# Patient Record
Sex: Female | Born: 1945 | Hispanic: No
Health system: Southern US, Community
[De-identification: ages and names within clinical notes are randomized; demographics above are authoritative.]

## PROBLEM LIST (undated history)

## (undated) DIAGNOSIS — Z930 Tracheostomy status: Secondary | ICD-10-CM

## (undated) DIAGNOSIS — Z862 Personal history of diseases of the blood and blood-forming organs and certain disorders involving the immune mechanism: Secondary | ICD-10-CM

## (undated) DIAGNOSIS — Z951 Presence of aortocoronary bypass graft: Secondary | ICD-10-CM

## (undated) DIAGNOSIS — I10 Essential (primary) hypertension: Secondary | ICD-10-CM

## (undated) DIAGNOSIS — I2510 Atherosclerotic heart disease of native coronary artery without angina pectoris: Secondary | ICD-10-CM

## (undated) DIAGNOSIS — E119 Type 2 diabetes mellitus without complications: Secondary | ICD-10-CM

## (undated) DIAGNOSIS — G4733 Obstructive sleep apnea (adult) (pediatric): Secondary | ICD-10-CM

---

## 2014-08-27 ENCOUNTER — Inpatient Hospital Stay
Admission: AD | Admit: 2014-08-27 | Discharge: 2014-09-24 | Disposition: A | Discharge status: Skilled Nursing Facility | Payer: Medicare PPO | Source: Ambulatory Visit | Attending: Internal Medicine | Admitting: Internal Medicine

## 2014-08-27 ENCOUNTER — Other Ambulatory Visit (HOSPITAL_COMMUNITY): Payer: Self-pay

## 2014-08-27 DIAGNOSIS — J9690 Respiratory failure, unspecified, unspecified whether with hypoxia or hypercapnia: Secondary | ICD-10-CM

## 2014-08-27 DIAGNOSIS — Z09 Encounter for follow-up examination after completed treatment for conditions other than malignant neoplasm: Secondary | ICD-10-CM | POA: Insufficient documentation

## 2014-08-27 DIAGNOSIS — Z931 Gastrostomy status: Secondary | ICD-10-CM

## 2014-08-27 HISTORY — DX: Atherosclerotic heart disease of native coronary artery without angina pectoris: I25.10

## 2014-08-27 HISTORY — DX: Obstructive sleep apnea (adult) (pediatric): G47.33

## 2014-08-27 HISTORY — DX: Type 2 diabetes mellitus without complications: E11.9

## 2014-08-27 HISTORY — DX: Essential (primary) hypertension: I10

## 2014-08-27 HISTORY — DX: Tracheostomy status: Z93.0

## 2014-08-27 HISTORY — DX: Personal history of diseases of the blood and blood-forming organs and certain disorders involving the immune mechanism: Z86.2

## 2014-08-27 HISTORY — DX: Presence of aortocoronary bypass graft: Z95.1

## 2014-08-31 ENCOUNTER — Other Ambulatory Visit (HOSPITAL_COMMUNITY): Payer: Self-pay

## 2014-08-31 LAB — COMPREHENSIVE METABOLIC PANEL
ALBUMIN: 2.6 g/dL — AB (ref 3.5–5.2)
ALK PHOS: 114 U/L (ref 39–117)
ALT: 21 U/L (ref 0–35)
ANION GAP: 3 — AB (ref 5–15)
AST: 24 U/L (ref 0–37)
BUN: 22 mg/dL (ref 6–23)
CALCIUM: 9 mg/dL (ref 8.4–10.5)
CHLORIDE: 102 mmol/L (ref 96–112)
CO2: 36 mmol/L — AB (ref 19–32)
CREATININE: 0.5 mg/dL (ref 0.50–1.10)
GFR calc Af Amer: >90 mL/min (ref >90)
GFR calc non Af Amer: >90 mL/min (ref >90)
GLUCOSE: 179 mg/dL — AB (ref 70–99)
Potassium: 4.1 mmol/L (ref 3.5–5.1)
Sodium: 141 mmol/L (ref 135–145)
Total Bilirubin: 1.1 mg/dL (ref 0.3–1.2)
Total Protein: 6.4 g/dL (ref 6.0–8.3)

## 2014-08-31 LAB — CBC
HCT: 31.1 % — ABNORMAL LOW (ref 36.0–46.0)
HEMOGLOBIN: 9.3 g/dL — AB (ref 12.0–15.0)
MCH: 27.8 pg (ref 26.0–34.0)
MCHC: 29.9 g/dL — ABNORMAL LOW (ref 30.0–36.0)
MCV: 93.1 fL (ref 78.0–100.0)
Platelets: 257 10*3/uL (ref 150–400)
RBC: 3.34 MIL/uL — ABNORMAL LOW (ref 3.87–5.11)
RDW: 17.5 % — AB (ref 11.5–15.5)
WBC: 9.9 10*3/uL (ref 4.0–10.5)

## 2014-09-01 ENCOUNTER — Other Ambulatory Visit (HOSPITAL_COMMUNITY): Payer: Self-pay

## 2014-09-02 LAB — BASIC METABOLIC PANEL
ANION GAP: 10 (ref 5–15)
BUN: 15 mg/dL (ref 6–23)
CO2: 32 mmol/L (ref 19–32)
Calcium: 8.9 mg/dL (ref 8.4–10.5)
Chloride: 94 mmol/L — ABNORMAL LOW (ref 96–112)
Creatinine, Ser: 0.55 mg/dL (ref 0.50–1.10)
GFR calc Af Amer: >90 mL/min (ref >90)
GFR calc non Af Amer: >90 mL/min (ref >90)
GLUCOSE: 111 mg/dL — AB (ref 70–99)
Potassium: 3.6 mmol/L (ref 3.5–5.1)
Sodium: 136 mmol/L (ref 135–145)

## 2014-09-02 LAB — MAGNESIUM: MAGNESIUM: 1.9 mg/dL (ref 1.5–2.5)

## 2014-09-04 ENCOUNTER — Other Ambulatory Visit (HOSPITAL_COMMUNITY): Payer: Self-pay

## 2014-09-05 ENCOUNTER — Other Ambulatory Visit (HOSPITAL_COMMUNITY): Payer: Self-pay

## 2014-09-07 ENCOUNTER — Other Ambulatory Visit (HOSPITAL_COMMUNITY): Payer: Self-pay

## 2014-09-07 LAB — CBC
HCT: 26.3 % — ABNORMAL LOW (ref 36.0–46.0)
HEMOGLOBIN: 8.4 g/dL — AB (ref 12.0–15.0)
MCH: 30 pg (ref 26.0–34.0)
MCHC: 31.9 g/dL (ref 30.0–36.0)
MCV: 93.9 fL (ref 78.0–100.0)
PLATELETS: 200 10*3/uL (ref 150–400)
RBC: 2.8 MIL/uL — AB (ref 3.87–5.11)
RDW: 18.7 % — ABNORMAL HIGH (ref 11.5–15.5)
WBC: 7 10*3/uL (ref 4.0–10.5)

## 2014-09-07 LAB — BASIC METABOLIC PANEL
Anion gap: 7 (ref 5–15)
BUN: 7 mg/dL (ref 6–23)
CALCIUM: 8.8 mg/dL (ref 8.4–10.5)
CO2: 38 mmol/L — ABNORMAL HIGH (ref 19–32)
CREATININE: 0.54 mg/dL (ref 0.50–1.10)
Chloride: 94 mmol/L — ABNORMAL LOW (ref 96–112)
GFR calc Af Amer: >90 mL/min (ref >90)
GLUCOSE: 90 mg/dL (ref 70–99)
Potassium: 3.2 mmol/L — ABNORMAL LOW (ref 3.5–5.1)
Sodium: 139 mmol/L (ref 135–145)

## 2014-09-08 DIAGNOSIS — I739 Peripheral vascular disease, unspecified: Secondary | ICD-10-CM

## 2014-09-08 NOTE — Consult Note (Signed)
Reason for Consult: Gangrene bilateral great toes and right index finger Referring Physician: Dr Shannon Wall is an 69 y.o. female.  HPI: Patient is a 69 year old woman with multiple medical problems. Patient had a heart attack in the end of December 2015 she underwent coronary artery bypass surgery on 07/27/2014. Patient states that after surgery she had 2 episodes that she went asystole and underwent cardioversion. Patient states she's had the black ischemic changes to her right index finger bilateral great toes since before she left [facility].  No past medical history on file.  No past surgical history on file.  No family history on file.  Social History:  has no tobacco, alcohol, and drug history on file.  Allergies: Allergies not on file  Medications: I have reviewed the patient's current medications.  Results for orders placed or performed during the hospital encounter of 08/27/14 (from the past 48 hour(s))  CBC     Status: Abnormal   Collection Time: 09/07/14  5:00 AM  Result Value Ref Range   WBC 7.0 4.0 - 10.5 K/uL   RBC 2.80 (L) 3.87 - 5.11 MIL/uL   Hemoglobin 8.4 (L) 12.0 - 15.0 g/dL   HCT 26.3 (L) 36.0 - 46.0 %   MCV 93.9 78.0 - 100.0 fL   MCH 30.0 26.0 - 34.0 pg   MCHC 31.9 30.0 - 36.0 g/dL   RDW 18.7 (H) 11.5 - 15.5 %   Platelets 200 150 - 400 K/uL  Basic metabolic panel     Status: Abnormal   Collection Time: 09/07/14  5:00 AM  Result Value Ref Range   Sodium 139 135 - 145 mmol/L   Potassium 3.2 (L) 3.5 - 5.1 mmol/L   Chloride 94 (L) 96 - 112 mmol/L   CO2 38 (H) 19 - 32 mmol/L   Glucose, Bld 90 70 - 99 mg/dL   BUN 7 6 - 23 mg/dL   Creatinine, Ser 0.54 0.50 - 1.10 mg/dL   Calcium 8.8 8.4 - 10.5 mg/dL   GFR calc non Af Amer >90 >90 mL/min   GFR calc Af Amer >90 >90 mL/min    Comment: (NOTE) The eGFR has been calculated using the CKD EPI equation. This calculation has not been validated in all clinical situations. eGFR's persistently <90 mL/min  signify possible Chronic Kidney Disease.    Anion gap 7 5 - 15    Dg Chest Port 1 View  09/07/2014   CLINICAL DATA:  Respiratory failure.  EXAM: PORTABLE CHEST - 1 VIEW  COMPARISON:  09/05/2014.  FINDINGS: Tracheostomy tube and right PICC line in stable position. Cardiomegaly with persistent bilateral from interstitial prominence suggestive congestive heart failure. Tiny left pleural effusion cannot be excluded. No pneumothorax. Prior thoracic spine fusion.  IMPRESSION: 1. Tracheostomy tube and right PICC line stable position. 2. Stable cardiomegaly and pulmonary interstitial prominence suggesting congestive heart failure without interim change.   Electronically Signed   By: Marcello Moores  Register   On: 09/07/2014 07:36    Review of Systems  All other systems reviewed and are negative.  There were no vitals taken for this visit. Physical Exam On examination patient has a strong dorsalis pedis pulse bilaterally there are no venous stasis changes in her legs no ulcers no cellulitis in either lower extremity the skin has good temperature and color bilaterally. Patient has very mild ischemic changes to the tip of bilateral great toes and the tip of her right index finger. All ulcerative areas are less than 1  cm in diameter. There is no cellulitis no drainage no signs of infection. Assessment/Plan: Assessment: Mild ischemic changes right index finger and bilateral great toes which is stable.  Plan: We will have the patient progressed her ambulation weightbearing as tolerated no restrictions with weightbearing. Do not feel the patient needs any specific wound care. She should heal unremarkably on their own.   Etiology is either due to thrombotic event from her open heart surgery or possibly due to pressors such as Levafed, or possibly HITT due to heparin.  I do not feel the patient needs any type of vascular surgery intervention. I will follow-up as needed.  DUDA,MARCUS V 09/08/2014, 5:47 PM

## 2014-09-08 NOTE — Progress Notes (Signed)
VASCULAR LAB PRELIMINARY  ARTERIAL  ABI completed:    RIGHT    LEFT    PRESSURE WAVEFORM  PRESSURE WAVEFORM  BRACHIAL line  BRACHIAL 122 *  DP   DP    AT 112 * AT 94 *  PT 89 * PT 107 *  PER   PER    GREAT TOE 120 NA GREAT TOE 88 NA    RIGHT LEFT  ABI 0.92, TBI 0.98 0.88, TBI 0.72   *Unable to evaluate Doppler waveforms.  Ariatna Jester, RVT 09/08/2014, 4:40 PM

## 2014-09-09 ENCOUNTER — Encounter: Payer: Self-pay | Admitting: Adult Health

## 2014-09-09 DIAGNOSIS — E669 Obesity, unspecified: Secondary | ICD-10-CM

## 2014-09-09 DIAGNOSIS — Z951 Presence of aortocoronary bypass graft: Secondary | ICD-10-CM | POA: Diagnosis not present

## 2014-09-09 DIAGNOSIS — R29898 Other symptoms and signs involving the musculoskeletal system: Secondary | ICD-10-CM

## 2014-09-09 DIAGNOSIS — J9621 Acute and chronic respiratory failure with hypoxia: Secondary | ICD-10-CM | POA: Diagnosis not present

## 2014-09-09 NOTE — Consult Note (Signed)
Name: Shannon Wall MRN: [id number] DOB: May 10, 1946    ADMISSION DATE:  08/27/2014 CONSULTATION DATE:  09/09/14  REFERRING MD :  [facility] MD   CHIEF COMPLAINT:  ?decannulation   BRIEF PATIENT DESCRIPTION:  69yo obese female smoker with hx CAD, HTN, DM, OSA (noncompliant with CPAP) [facility] for 3 vessel CABG not amenable to PCI.  Had prolonged post op course c/b respiratory failure now s/p trach, asystolic arrest, Afib/flutter, L ptx, HITT.  She was ultimately weaned from vent, tol ATC and was tx 2/19 [facility] for [facility] rehab efforts.  PCCM consulted 3/3 for input regarding potential decannulation.   SIGNIFICANT EVENTS    STUDIES:     HISTORY OF PRESENT ILLNESS:  69yo obese female with hx CAD, HTN, DM, OSA (noncompliant with CPAP) [facility] for 3 vessel CABG not amenable to PCI.  Had prolonged post op course c/b respiratory failure now s/p trach, asystolic arrest, Afib/flutter, L ptx, HITT.  She was ultimately weaned from vent, tol ATC and was tx 2/19 to [facility] for ongoing rehab efforts.  PCCM consulted 3/3 for input regarding potential decannulation.    PAST MEDICAL HISTORY :   has a past medical history of CAD (coronary artery disease); S/P CABG (coronary artery bypass graft); OSA (obstructive sleep apnea); HTN (hypertension); Diabetes mellitus; History of heparin-induced thrombocytopenia; and Tracheostomy status.  has no past surgical history on file. Prior to Admission medications   lipitor  Lasix  Zosyn  zoloft  potassium  Melatonin  isosorbide dinitrate  neurontin  eliquis  pepcid  miralax  Nicotine patch  Vit D3  Asa    Allergies  Allergen Reactions  . Heparin     HITT    FAMILY HISTORY:  family history is not on file. SOCIAL HISTORY:    REVIEW OF SYSTEMS:   As per HPI - All other systems reviewed and were neg.    SUBJECTIVE:   VITAL SIGNS:  Reviewed at bedside.  Any abnormal values discussed  in A/P  PHYSICAL EXAMINATION: General:  Obese female, NAD in chair on ATC  Neuro:  Awake, alert, anxious, MAE, appropriate  HEENT:  Mm moist, #6 cuffed trach in place with PMV, c/d, good phonation, ATC  Cardiovascular:  s1s2 distant  Lungs:  resps even non labored, few scattered rhonchi  Abdomen:  Obese, round, soft, +bs  Musculoskeletal:  Warm and dry, 1-2+ BLE edema     Recent Labs Lab 09/07/14 0500  NA 139  K 3.2*  CL 94*  CO2 38*  BUN 7  CREATININE 0.54  GLUCOSE 90    Recent Labs Lab 09/07/14 0500  HGB 8.4*  HCT 26.3*  WBC 7.0  PLT 200   No results found.  ASSESSMENT / PLAN:  Trach dependent respiratory failure - tolerating 35% ATC at all times, tol PMV well.  Eating/drinking/phonating well.   OSA/OHS - non compliant with CPAP prior to admit ?COPD - active smoker    REC -  Downsize trach to #4 cuffless  Trial occlusive trach cap daytime, un-cap qhs  Cont pt/ot efforts  Nutritional support for weight loss   Would NOT recommend decannulation at this time given obesity, hx OSA, still with fairly significant deconditioning.  Continue ATC as tolerated, downsize to cuffless #4 with daytime capping trials and likely d/c with trach with goal weight loss and eventual decannulation.    Dirk DressKaty Whiteheart, NP 09/09/2014  11:44 AM Pager: [phone] or [phone]  Attending:  I have seen and examined the patient with nurse practitioner/resident and agree with the note above.   I agree with Orpha Bur.  I think that when she is up and walking around consistently it would be OK to decannulate, but now is not the right time.  Her lungs are clear but she is somewhat dyspneic just talking to me.  I think that her deconditioining puts her at excessive risk of recurrent complication (including respiratory failure) to justify removing the trach.  I agree that it could be downsized to a #4 cuffless  Heber Nashua, MD [place] [facility] Pager: [phone] Cell: [phone] If no  response, call [phone]

## 2014-09-13 DIAGNOSIS — R5381 Other malaise: Secondary | ICD-10-CM

## 2014-09-13 DIAGNOSIS — G4733 Obstructive sleep apnea (adult) (pediatric): Secondary | ICD-10-CM

## 2014-09-13 DIAGNOSIS — Z930 Tracheostomy status: Secondary | ICD-10-CM

## 2014-09-13 NOTE — Progress Notes (Addendum)
   Name: Shannon BaileyHarriett Wall MRN: [id number] DOB: April 25, 1946    ADMISSION DATE:  08/27/2014 CONSULTATION DATE:  09/09/14  REFERRING MD :  [facility]   CHIEF COMPLAINT:  ?decannulation   BRIEF PATIENT DESCRIPTION:  69yo obese female smoker with hx CAD, HTN, DM, OSA (noncompliant with CPAP) [facility] for 3 vessel CABG not amenable to PCI.  Had prolonged post op course c/b respiratory failure now s/p trach, asystolic arrest, Afib/flutter, L ptx, HITT.  She was ultimately weaned from vent, tol ATC and was tx 2/19 to [facility] for ongoing rehab efforts.  PCCM consulted 3/3 for input regarding potential decannulation.   SIGNIFICANT EVENTS    STUDIES:     HISTORY OF PRESENT ILLNESS:  69yo obese female with hx CAD, HTN, DM, OSA (noncompliant with CPAP) [facility] 3 vessel CABG not amenable to PCI.  Had prolonged post op course c/b respiratory failure now s/p trach, asystolic arrest, Afib/flutter, L ptx, HITT.  She was ultimately weaned from vent, tol ATC and was tx 2/19 to [facility] for ongoing rehab efforts.  PCCM consulted 3/3 for input regarding potential decannulation.       SUBJECTIVE:   VITAL SIGNS: Vital signs reviewed. Abnormal values will appear under impression plan section.    PHYSICAL EXAMINATION: General:  Obese female, NAD in bed on ATC  Neuro:  Awake, alert, anxious, MAE, appropriate , speaks well, wants trach out HEENT:  Mm moist, #6  trach in place with PMV, c/d, good phonation, ATC  Cardiovascular:  s1s2 distant  Lungs:  resps even non labored, few scattered rhonchi  Abdomen:  Obese, round, soft, +bs  Musculoskeletal:  Warm and dry, 1-2+ BLE edema     Recent Labs Lab 09/07/14 0500  NA 139  K 3.2*  CL 94*  CO2 38*  BUN 7  CREATININE 0.54  GLUCOSE 90    Recent Labs Lab 09/07/14 0500  HGB 8.4*  HCT 26.3*  WBC 7.0  PLT 200   No results found.  ASSESSMENT / PLAN:  Trach dependent respiratory failure -  tolerating 35% ATC at all times, tol PMV well.  Eating/drinking/phonating well.   OSA/OHS - non compliant with CPAP prior to admit ?COPD - active smoker    REC -  Downsize trach to #4 cuffless (still has 6 in place 3/6) Trial occlusive trach cap daytime, un-cap qhs  Cont pt/ot efforts  Nutritional support for weight loss   Would NOT recommend decannulation at this time given obesity, hx OSA, still with fairly significant deconditioning.  Continue ATC as tolerated, downsize to cuffless #4 with daytime capping trials and likely d/c with trach with goal weight loss and eventual decannulation.    Brett CanalesSteve Minor ACNP [facility] Pager [phone] till 3 pm If no answer page [phone]  As above, downsize to a cuffless 4 and begin capping trials.  CPAP for night support ordered to test if tolerated.  If continues to do well will reconsider towards the end of the week but would not be recommended given her overall deconditioning, size, OSA and inability to stand.  PT is also ordered to see how much patient can ambulate.  Patient seen and examined, agree with above note.  I dictated the care and orders written for this patient under my direction.  Alyson ReedyWesam G Raneshia Derick, MD [phone]  09/13/2014, 10:16 AM

## 2014-09-14 LAB — CBC
HCT: 29 % — ABNORMAL LOW (ref 36.0–46.0)
Hemoglobin: 9 g/dL — ABNORMAL LOW (ref 12.0–15.0)
MCH: 28.4 pg (ref 26.0–34.0)
MCHC: 31 g/dL (ref 30.0–36.0)
MCV: 91.5 fL (ref 78.0–100.0)
Platelets: 283 10*3/uL (ref 150–400)
RBC: 3.17 MIL/uL — ABNORMAL LOW (ref 3.87–5.11)
RDW: 16.4 % — ABNORMAL HIGH (ref 11.5–15.5)
WBC: 6.7 10*3/uL (ref 4.0–10.5)

## 2014-09-14 LAB — BASIC METABOLIC PANEL
Anion gap: 6 (ref 5–15)
BUN: 6 mg/dL (ref 6–23)
CO2: 37 mmol/L — ABNORMAL HIGH (ref 19–32)
Calcium: 9.2 mg/dL (ref 8.4–10.5)
Chloride: 94 mmol/L — ABNORMAL LOW (ref 96–112)
Creatinine, Ser: 0.62 mg/dL (ref 0.50–1.10)
GFR calc Af Amer: >90 mL/min (ref >90)
GFR calc non Af Amer: >90 mL/min (ref >90)
Glucose, Bld: 111 mg/dL — ABNORMAL HIGH (ref 70–99)
Potassium: 3.4 mmol/L — ABNORMAL LOW (ref 3.5–5.1)
Sodium: 137 mmol/L (ref 135–145)

## 2014-09-15 LAB — POTASSIUM: POTASSIUM: 4.3 mmol/L (ref 3.5–5.1)

## 2014-09-15 LAB — CLOSTRIDIUM DIFFICILE BY PCR: CDIFFPCR: NEGATIVE

## 2014-09-20 DIAGNOSIS — Z09 Encounter for follow-up examination after completed treatment for conditions other than malignant neoplasm: Secondary | ICD-10-CM | POA: Diagnosis not present

## 2014-09-20 DIAGNOSIS — J9600 Acute respiratory failure, unspecified whether with hypoxia or hypercapnia: Secondary | ICD-10-CM | POA: Diagnosis not present

## 2014-09-20 DIAGNOSIS — J9602 Acute respiratory failure with hypercapnia: Secondary | ICD-10-CM | POA: Diagnosis not present

## 2014-09-20 DIAGNOSIS — J9690 Respiratory failure, unspecified, unspecified whether with hypoxia or hypercapnia: Secondary | ICD-10-CM | POA: Insufficient documentation

## 2014-09-20 NOTE — Progress Notes (Signed)
   Name: Shannon Wall MRN: [id number] DOB: 1946/05/29    ADMISSION DATE:  08/27/2014 CONSULTATION DATE:  09/09/14  REFERRING MD :  [facility]   CHIEF COMPLAINT:  ?decannulation   BRIEF PATIENT DESCRIPTION:  69yo obese female smoker with hx CAD, HTN, DM, OSA (noncompliant with CPAP) [facility] for 3 vessel CABG not amenable to PCI.  Had prolonged post op course c/b respiratory failure now s/p trach, asystolic arrest, Afib/flutter, L ptx, HITT.  She was ultimately weaned from vent, tol ATC and was tx 2/19 to [facility] for ongoing rehab efforts.  PCCM consulted 3/3 for input regarding potential decannulation.   SIGNIFICANT EVENTS    STUDIES:     HISTORY OF PRESENT ILLNESS:  69yo obese female with hx CAD, HTN, DM, OSA (noncompliant with CPAP) [facility] for 3 vessel CABG not amenable to PCI.  Had prolonged post op course c/b respiratory failure now s/p trach, asystolic arrest, Afib/flutter, L ptx, HITT.  She was ultimately weaned from vent, tol ATC and was tx 2/19 to [facility] for ongoing rehab efforts.  PCCM consulted 3/3 for input regarding potential decannulation.       SUBJECTIVE:   VITAL SIGNS: Vital signs reviewed. Abnormal values will appear under impression plan section.    PHYSICAL EXAMINATION: General:  Obese female, NAD in bed on ATC  Neuro:  Awake, alert, anxious, MAE, appropriate , speaks well, wants trach out HEENT:  Mm moist, #6  trach in place with PMV, c/d, good phonation, ATC  Cardiovascular:  s1s2 distant  Lungs:  resps even non labored, few scattered rhonchi  Abdomen:  Obese, round, soft, +bs  Musculoskeletal:  Warm and dry, 1-2+ BLE edema     Recent Labs Lab 09/14/14 0500 09/15/14 0600  NA 137  --   K 3.4* 4.3  CL 94*  --   CO2 37*  --   BUN 6  --   CREATININE 0.62  --   GLUCOSE 111*  --     Recent Labs Lab 09/14/14 0500  HGB 9.0*  HCT 29.0*  WBC 6.7  PLT 283   No results found.  ASSESSMENT  / PLAN:  Trach dependent respiratory failure - tolerating 35% ATC at all times, tol PMV well.  Eating/drinking/phonating well.   OSA/OHS - non compliant with CPAP prior to admit ?COPD - active smoker    Discussion  Long discussion w/ pt. Has done well w/ capped trach >72 hrs. Has BIPAP at home. She and her family her have verbalized an understanding that the BIPAP is a must going forward.   REC -  decannulate Monitor X 48 hrs BIPAP at HS and w/ rest  Cont pt/ot efforts  Nutritional support for weight loss    Simonne MartinetPeter E Babcock, NP  09/20/2014, 11:12 AM  STAFF NOTE: Shannon CarbonI, Shannon Ingman, MD FACP have personally reviewed patient's available data, including medical history, events of note, physical examination and test results as part of my evaluation. I have discussed with resident/NP and other care providers such as pharmacist, RN and RRT. In addition, I personally evaluated patient and elicited key findings of: no distress, good cough, eating well, extensive discussion on comliance with bipap nocturnal, proceed to [employer] Corporationdecann  Shannon Demonbreun J. Tyson AliasFeinstein, MD, FACP Pgr: [phone] [facility] 09/20/2014 5:10 PM

## 2014-09-22 DIAGNOSIS — J9602 Acute respiratory failure with hypercapnia: Secondary | ICD-10-CM | POA: Diagnosis not present

## 2014-09-22 NOTE — Progress Notes (Signed)
   Name: Shannon Wall MRN: [id number] DOB: 05/05/1946    ADMISSION DATE:  08/27/2014 CONSULTATION DATE:  09/09/14  REFERRING MD :  [facility]   CHIEF COMPLAINT:  ?decannulation   BRIEF PATIENT DESCRIPTION:  69yo obese female smoker with hx CAD, HTN, DM, OSA (noncompliant with CPAP) [facility] for 3 vessel CABG not amenable to PCI.  Had prolonged post op course c/b respiratory failure now s/p trach, asystolic arrest, Afib/flutter, L ptx, HITT.  She was ultimately weaned from vent, tol ATC and was tx 2/19 to [facility] for ongoing rehab efforts.  PCCM consulted 3/3 for input regarding potential decannulation.   SIGNIFICANT EVENTS    STUDIES:     HISTORY OF PRESENT ILLNESS:  69yo obese female with hx CAD, HTN, DM, OSA (noncompliant with CPAP) [facility] for 3 vessel CABG not amenable to PCI.  Had prolonged post op course c/b respiratory failure now s/p trach, asystolic arrest, Afib/flutter, L ptx, HITT.  She was ultimately weaned from vent, tol ATC and was tx 2/19 to [facility] for ongoing rehab efforts.  PCCM consulted 3/3 for input regarding potential decannulation.       SUBJECTIVE:  Feels great  VITAL SIGNS: Vital signs reviewed. Abnormal values will appear under impression plan section.    PHYSICAL EXAMINATION: General:  Obese female, NAD  On 1 liter  Neuro:  Awake, alert, anxious, MAE, appropriate , speaks well, wants trach out HEENT:  Trach site dressing CD&I. Good phonation Cardiovascular:  s1s2 distant  Lungs:  resps even non labored Abdomen:  Obese, round, soft, +bs  Musculoskeletal:  Warm and dry, 1-2+ BLE edema    No results for input(s): NA, K, CL, CO2, BUN, CREATININE, GLUCOSE in the last 168 hours. No results for input(s): HGB, HCT, WBC, PLT in the last 168 hours. No results found.  ASSESSMENT / PLAN:  Trach dependent respiratory failure - tolerating 35% ATC at all times, tol PMV well.  Eating/drinking/phonating  well.   OSA/OHS - non compliant with CPAP prior to admit ?COPD - active smoker    Discussion  decannulated 3/14. Doing well w/ NIPPV at HS.   REC -  O2 w/ activity BIPAP at HS and w/ rest  Cont pt/ot efforts  Nutritional support for weight loss  We will s/o she can f/u w/ Orson AloeHenderson (her primary pulmonologist)   Simonne MartinetPeter E Babcock, NP 09/22/2014 9:52 AM  STAFF NOTE: Cindi CarbonI, Nour Rodrigues, MD FACP have personally reviewed patient's available data, including medical history, events of note, physical examination and test results as part of my evaluation. I have discussed with resident/NP and other care providers such as pharmacist, RN and RRT. In addition, I personally evaluated patient and elicited key findings of: trach out, no distress in chair, CTA, bipap important and have stressed to her, would obtain ABG at 5 am and ensure adequate nocturnal support is being used, if ph less 7.33 please discuss with pccm changes to nocturnal settings, encourage coughing secretions up Will sign off  Mcarthur Rossettianiel J. Tyson AliasFeinstein, MD, FACP Pgr: [phone] [place] [facility] [facility] 09/22/2014 1:07 PM

## 2016-08-07 IMAGING — CR DG CHEST 1V PORT
1 series · 1 of 1 positions shown · non-contrast
Comparison: None.

CLINICAL DATA: Respiratory failure

EXAM:
PORTABLE CHEST - 1 VIEW

[AP]
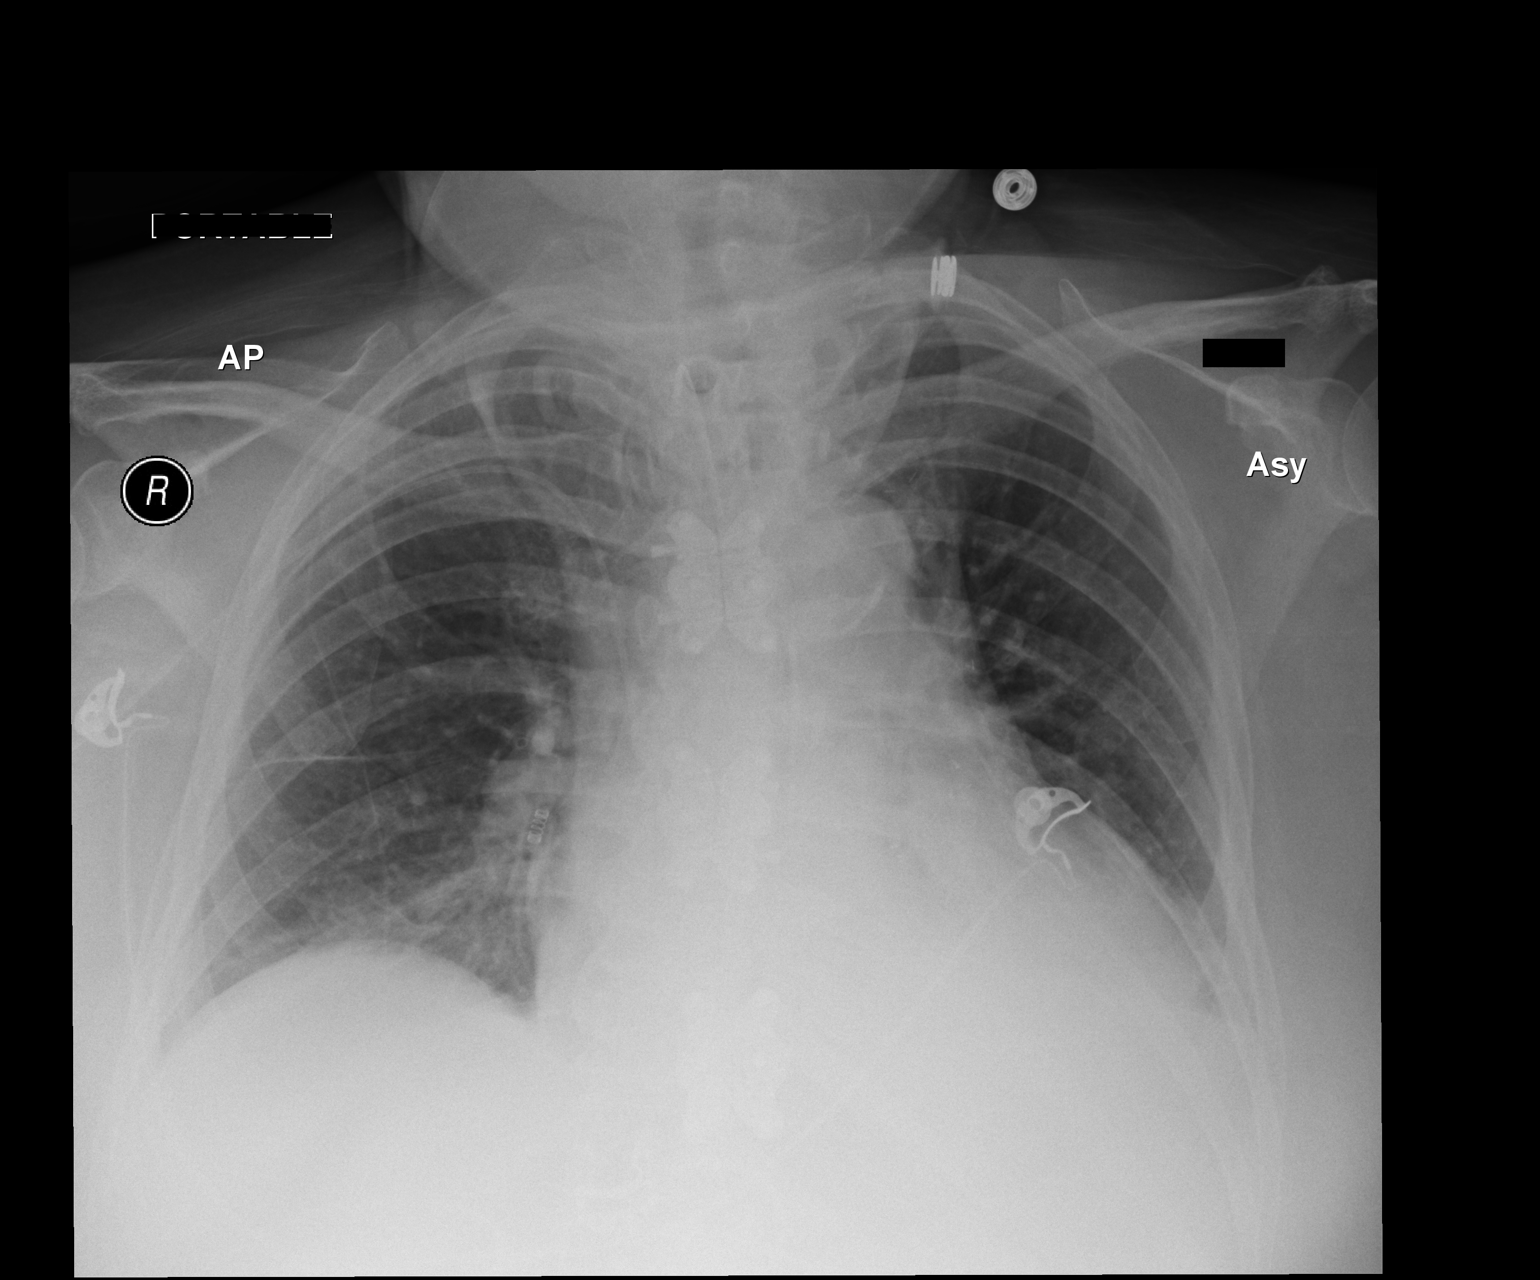

[1 of 1 positions shown; findings below may reference images not displayed]

FINDINGS: The tracheostomy tube tip is above the carina. There is a right arm
PICC line with tip in the cavoatrial junction. Heart size is normal.
There is calcified atherosclerotic plaque within the 8 or car arch.
Lung volumes are low and there is a retrocardiac opacity in the left
lung base. Atelectasis noted in the right base. Small effusions are
suspected. There is pulmonary vascular congestion.
IMPRESSION: 1. Retrocardiac opacity in the left base may represent pneumonia.
2. Small effusions and pulmonary vascular congestion.

## 2021-11-06 DEATH — deceased
